# Patient Record
Sex: Female | Born: 1975 | Race: White | Hispanic: No | Marital: Married | Smoking: Never smoker
Health system: Southern US, Community
[De-identification: ages and names within clinical notes are randomized; demographics above are authoritative.]

---

## 2002-07-09 ENCOUNTER — Other Ambulatory Visit: Admission: RE | Admit: 2002-07-09 | Discharge: 2002-07-09 | Payer: Self-pay | Admitting: Obstetrics and Gynecology

## 2003-12-15 ENCOUNTER — Other Ambulatory Visit: Admission: RE | Admit: 2003-12-15 | Discharge: 2003-12-15 | Payer: Self-pay | Admitting: Obstetrics and Gynecology

## 2004-06-11 ENCOUNTER — Inpatient Hospital Stay (HOSPITAL_COMMUNITY): Admission: AD | Admit: 2004-06-11 | Discharge: 2004-06-12 | Payer: Self-pay | Admitting: Obstetrics and Gynecology

## 2004-12-12 ENCOUNTER — Inpatient Hospital Stay (HOSPITAL_COMMUNITY): Admission: AD | Admit: 2004-12-12 | Discharge: 2004-12-12 | Payer: Self-pay | Admitting: Obstetrics and Gynecology

## 2004-12-24 ENCOUNTER — Inpatient Hospital Stay (HOSPITAL_COMMUNITY): Admission: AD | Admit: 2004-12-24 | Discharge: 2004-12-26 | Payer: Self-pay | Admitting: Obstetrics and Gynecology

## 2005-01-03 ENCOUNTER — Encounter: Admission: RE | Admit: 2005-01-03 | Discharge: 2005-02-02 | Payer: Self-pay | Admitting: Obstetrics and Gynecology

## 2005-01-14 ENCOUNTER — Inpatient Hospital Stay (HOSPITAL_COMMUNITY): Admission: AD | Admit: 2005-01-14 | Discharge: 2005-01-17 | Payer: Self-pay | Admitting: Obstetrics and Gynecology

## 2005-02-28 ENCOUNTER — Other Ambulatory Visit: Admission: RE | Admit: 2005-02-28 | Discharge: 2005-02-28 | Payer: Self-pay | Admitting: Obstetrics and Gynecology

## 2006-12-02 ENCOUNTER — Inpatient Hospital Stay (HOSPITAL_COMMUNITY): Admission: AD | Admit: 2006-12-02 | Discharge: 2006-12-02 | Payer: Self-pay | Admitting: Obstetrics and Gynecology

## 2006-12-03 ENCOUNTER — Inpatient Hospital Stay (HOSPITAL_COMMUNITY): Admission: AD | Admit: 2006-12-03 | Discharge: 2006-12-05 | Payer: Self-pay | Admitting: Obstetrics and Gynecology

## 2010-06-22 NOTE — H&P (Signed)
NAME:  Lauren Schneider, Lauren Schneider                 ACCOUNT NO.:  [id number]   MEDICAL RECORD NO.:  [id number]          PATIENT TYPE:  INP   LOCATION:  9137                          FACILITY:  [facility]   PHYSICIAN:  Lauren Schneider, M.D. DATE OF BIRTH:  07/06/1975   DATE OF ADMISSION:  12/03/2006  DATE OF DISCHARGE:                              HISTORY & PHYSICAL   HISTORY:  Lauren Schneider is a 35 year old gravida 4, para 1-0-2-1 at 39-3/7  weeks who presented today to maternity admissions unit in active labor.  Her cervix had been 3-4 cm, yesterday, in maternity admissions.  Uterine  contractions increased at approximately 4:30 a.m. this morning, and when  the patient presented in maternity admissions unit.  She was in a  transitional labor, the patient did then deliver in maternity admissions  very quickly after that.  She did not receive any group B strep  prophylaxis; however, her water was broken just prior to delivery.   PREGNANCY HAS BEEN REMARKABLE FOR:  1. First trimester spotting.  2. History of LEEP procedure.  3. Anxiety disorder.  4. Group B strep positive.   PRENATAL LABS:  Blood type is AB positive, Rh antibody negative, VDRL  nonreactive, Rubella titer positive, hepatitis B surface antigen  negative, HIV is nonreactive.  GC and Chlamydia cultures were negative  in January.  Pap was normal in January, cystic fibrosis testing was  negative.  Hemoglobin upon entry into practice was 12.5.  It was within  normal limits at 28 weeks.  First trimester screen was normal.  Glucola  was normal.  Group B Strep culture was positive at 36 weeks.   HISTORY OF PRESENT PREGNANCY:  The patient entered care at approximately  11-12 weeks she has first trimester screen was normal.  She had an  ultrasound at 18 weeks showing normal growth.  Glucola was normal.  She  had an ultrasound at 36 weeks showing vertex presentation with normal  growth.  Group B strep culture was positive at 36 weeks.  No other  complications.   OBSTETRICAL HISTORY:  In 1992 she had a first trimester termination, she  did have fever 2 days after the termination, but did not require a  rehospitalization.  In 2006 she had a vaginal birth of a female infant,  weight 7 pounds at 37-5/7 weeks.  She was in labor for 4 hours.  She had  a pudendal anesthesia.  In 2007 she had a first trimester miscarriage.   MEDICAL HISTORY:  She had a LEEP procedure in 1999.  She reports the  usual childhood illnesses.  Does have a history of anxiety.  She is not  on any current medication.  She did fracture her nose x2, and other  surgery were tonsils.   FAMILY HISTORY:  Her father had an MI.  Maternal grandmother had a  stroke.  Paternal grandmother had dementia.  Her father had bladder  cancer.  She had a history is unremarkable so the patient is allergic to  erythromycin which causes nausea and vomiting.   SOCIAL HISTORY:  The patient is Caucasian.  She is of the [employer].  Her husband's name is Lauren Schneider.  The patient is college educated, as  is her husband.  She has been followed by the certified nurse midwife  service at [facility].  She denies any alcohol, drug, or tobacco use  during this pregnancy.   PHYSICAL EXAM:  VITAL SIGNS:  Stable.  The patient is afebrile.  HEENT: Within normal limits.  LUNGS:  Breath sounds are clear.  HEART:  Regular rhythm without murmur.  BREASTS:  Soft and nontender.  ABDOMEN:  Fundal height is approximately 38 cm.  Estimated fetal weight  is 7 to 7-1/2 pounds.  Uterine contractions are every 2 minutes,  moderate-to-strong quality.  PELVIC:  Cervix initially was 7 cm, 100% vertex at a minus one station  with bulging bag of water.  Upon attempting to transfer the patient to  labor and delivery, and getting her in wheelchair, the patient began to  note more pressure; this was approximately 5 minutes later.  After 7 cm  her cervix was complete, her water broke during the exam, and  she  delivered quickly after that and delivered quickly after that at 8:06  a.m.  She had a viable female by the name of Lauren Schneider.  No lacerations were  noted.  Fluid was clear.  EXTREMITIES:  Deep tendon reflexes were 2+ without clonus.  There was a  trace edema noted.   IMPRESSION:  1. Intrauterine pregnancy at 39-3/7 weeks.  2. Transitional labor.  3. Positive group B strep with no opportunity for prophylaxis.   PLAN:  1. Admit to a [facility] for consult with Dr.      Silverio Schneider as attending physician.  2. Delivery was accomplished in maternity admissions.  The patient      will, therefore, be transferred from here to postpartum unit.  3. Nursery will be advised of the inability to prophylax for group B      strep.      Lauren Schneider, C.N.M.      Lauren Schneider, M.D.  Electronically Signed    VLL/MEDQ  D:  12/03/2006  T:  12/04/2006  Job:  [id number]

## 2010-06-25 NOTE — Discharge Summary (Signed)
NAME:  Lauren Schneider, Lauren Schneider                 ACCOUNT NO.:  [id number]   MEDICAL RECORD NO.:  [id number]          PATIENT TYPE:  INP   LOCATION:  9302                          [facility]ACILITY:  [facility]   PHYSICIAN:  Hal Morales, M.D.DATE OF BIRTH:  05/11/1975   DATE OF ADMISSION:  01/14/2005  DATE OF DISCHARGE:  01/17/2005                                 DISCHARGE SUMMARY   ADMISSION DIAGNOSES:  1.  Postpartum mastitis.  2.  Fever, related to #1.   HOSPITAL PROCEDURES:  1.  Intravenous antibiotics.  2.  Wound cultures.   HOSPITAL COURSE:  The patient was admitted after having been treated  outpatient for mastitis with initial defervescence x2 days, followed by  recurrence of fever, tenderness and erythema.  She was admitted therefore IV  antibiotic treatment of her current mastitis, which was refractory to  previous antibiotics.  She was treated initially with Keflex and upon  admission, had a fever and induration and erythema of the right breast.  She  was placed on triple antibiotics, including gentamicin and clindamycin and  ampicillin, all given IV.  She continued to pump breast milk throughout her  stay.  On hospital day #2, she was doing well, although breasts remained  sore.  She was afebrile for 24 hours, last fever was January 15, 2005 at 6  a.m.  IV antibiotics were continued until January 16, 2005, when her IV  infiltrated and she was changed to p.o. antibiotics (Augmentin).  On  January 17, 2005, she was doing well.  The breast on the right was  significantly less tender with resolution of erythema, small amounts of  induration continued at the blocked milk ducts, but otherwise status was  improving and she had been without fever for more than 48 hours.  She was  deemed to have received full benefit of her hospital stay and was discharged  home on p.o. Augmentin.   DISCHARGE MEDICATIONS:  1.  Augmentin 500 mg p.o. q.8h. x7 days.  2.  Motrin p.r.n.   DISCHARGE LABORATORY:   Breast milk culture pending.  Urine culture was no  growth.  White blood cell count 10.8, hemoglobin 12.5, platelets 363.   DISCHARGE INSTRUCTIONS:  Routine care with continuing breast feeding and  massage of plugged ducts and monitoring of temperature.  The patient is to  call for temperature over 100.5 or increase in redness or tenderness.   DISCHARGE FOLLOWUP:  In two weeks at [facility] or p.r.n.      Marie L. Williams, C.N.M.      Hal Morales, M.D.  Electronically Signed    MLW/MEDQ  D:  01/17/2005  T:  01/17/2005  Job:  [id number]

## 2010-06-25 NOTE — H&P (Signed)
NAME:  Lauren Schneider, Lauren Schneider                 ACCOUNT NO.:  [id number]   MEDICAL RECORD NO.:  [id number]          PATIENT TYPE:  INP   LOCATION:  9170                          [facility]ACILITY:  [facility]   PHYSICIAN:  Hal Morales, M.D.DATE OF BIRTH:  1976/01/24   DATE OF ADMISSION:  12/24/2004  DATE OF DISCHARGE:                                HISTORY & PHYSICAL   HISTORY OF PRESENT ILLNESS:  This is a 35 year old gravida 2, para 0-0-1-0  at 37-5/7 weeks, who presents after rupturing membranes earlier this evening  with onset of contractions subsequently. She denies any bleeding and reports  positive fetal movement.  The pregnancy has been followed by the nurse  midwife service and remarkable for:  1.  Long cycles.  2.  History of abnormal Pap with LEEP.  3.  Anxiety.   ALLERGIES:  ERYTHROMYCIN causes nausea and vomiting.   OBSTETRIC HISTORY:  Remarkable for elective AB in 1992 complicated by a  fever 2 days later.   PAST MEDICAL HISTORY:  Abnormal pap with LEEP procedure in 1999, history of  childhood varicella, and history of anxiety for which she does not take  medication.   PAST SURGICAL HISTORY:  Remarkable for an EAB in 1992 and tonsillectomy at  age 30.   FAMILY HISTORY:  Remarkable for father with MI.  Grandfather with emphysema.  Mother with diabetes.  Mother and grandmother with stroke.  Father with  bladder cancer.  Sister with anxiety.   GENETIC HISTORY:  Remarkable for a nephew with heart murmur; father of the  baby's uncle with muscular dystrophy; and patient's sister, who had twin  boys.   SOCIAL HISTORY:  The patient is married to [employer], who is involved and  supportive.  She does not report a religious affiliation.  She works as a  Contractor.   PRENATAL LABORATORY DATA:  Hemoglobin 12.2.  Platelets 326,000.  Blood type  AB positive.  Antibody screen negative.  RPR nonreactive.  Rubella immune.  Hepatitis negative.  HIV negative.  Gonorrhea and Chlamydia negative.   Toxo  titers negative.   HISTORY OF PRESENT PREGNANCY:  The patient entered care at [redacted] weeks  gestation.  She was treated for a urinary tract infection early in  pregnancy.  Clot screen was normal.  Anatomy ultrasound was normal.  Glucola  was normal in midpregnancy, and group B strep was negative at term.   OBJECTIVE DATA:  VITAL SIGNS:  Stable.  Afebrile.  HEENT:  Within normal limits.  Thyroid normal.  Not enlarged.  CHEST:  Clear to auscultation.  HEART:  Regular rate and rhythm.  ABDOMEN:  Gravid.  Vertex Leopold's.  EFM shows reactive fetal heart rate  with contractions every 2 minutes.  PELVIC:  Cervix is 5, 90, and -1.  Vertex presentation with positive  Nitrazine and positive ferning.  Clear fluid leaking.  EXTREMITIES:  Within normal limits.   ASSESSMENT:  1.  Intrauterine pregnancy at term.  2.  Spontaneous rupture of membranes.  3.  Active labor.   PLAN:  1.  Admit to birthing suite per Dr. Pennie Rushing.  2.  Routine CNM orders.  3.  Epidural p.r.n.      Marie L. Williams, C.N.M.      Hal Morales, M.D.  Electronically Signed    MLW/MEDQ  D:  12/24/2004  T:  12/24/2004  Job:  [id number]

## 2010-06-25 NOTE — H&P (Signed)
NAME:  Lauren Schneider, Lauren Schneider                 ACCOUNT NO.:  [id number]   MEDICAL RECORD NO.:  [id number]          PATIENT TYPE:  MAT   LOCATION:  MATC                          [facility]:  [facility]   PHYSICIAN:  Janine Limbo, M.D.DATE OF BIRTH:  05/28/1975   DATE OF ADMISSION:  01/14/2005  DATE OF DISCHARGE:                                HISTORY & PHYSICAL   HISTORY OF PRESENT ILLNESS:  Lauren Schneider is a 35 year old, gravida 2, para 1-0-  1-1, who is admitted at 77 days postpartum with a fever of 103 degrees,  possible mastitis and pelvic tenderness.  The patient was initiated on a  course of antibiotic for mastitis on Monday with fever present at that time.  Subsequent to that visit, the patient reports her symptoms improved for  three days, however, today she began having increased fever and body aches  again.  The patient reports that both her breasts are sore bilaterally but  she has no red areas.  The patient reports that she is using Tylenol and  ibuprofen to treat her fever.  The patient reports that she has continued to  pump breast milk as her infant does not latch to the breast.  The patient  denies any nausea or vomiting, abdominal pain, UTI signs and symptoms.  The  patient reports normal lochia and very small amounts which has been brown to  light pink today without odor.  She denies any flu or upper respiratory  infection signs and symptoms.  The patient reports that she has normal bowel  and bladder habits at the present time.   CURRENT MEDICATIONS:  Keflex, ibuprofen, Tylenol, prenatal vitamins.   ALLERGIES:  E-MYCIN causes stomach upset.   OBSTETRIC HISTORY:  1.  Pregnancy #1, elective interruption of pregnancy.  2.  Pregnancy #2, December 24, 2004, spontaneous vaginal delivery, viable      female infant, without complications at term.   GYNECOLOGIC HISTORY:  Significant for dysplasia with LEEP, 1999.   MEDICAL HISTORY:  Negative.   SURGICAL HISTORY:  1.  Tonsillectomy at  35 years old.  2.  Wisdom teeth at 35 years old.  3.  Therapeutic abortion, 1992.   HOSPITALIZATIONS:  Childbirth only.   SOCIAL HISTORY:  The patient is a married white female with negative  domestic violence screen.  The patient denies tobacco, alcohol, or street  drugs.   PHYSICAL EXAMINATION:  VITAL SIGNS:  Temperature 103.2 degrees, blood  pressure 103/66, heart rate 133, respirations 24.  GENERAL APPEARANCE:  The patient is in no apparent distress, however, she is  ill-appearing and well nourished female.  Color is satisfactory.  SKIN:  Warm and dry.  HEENT:  Within normal limits.  LUNGS:  Clear.  HEART:  Regular rate and rhythm.  BREASTS:  Engorged bilaterally with no areas of redness noted.  The right  breast is more engorged than the left breast with areas of plugged milk  ducts noted in upper outer quadrant of the right breast, more prominent than  left.  BACK:  Negative CVA tenderness.  ABDOMEN:  Soft and nontender with positive bowel sounds in all  four  quadrants.  EXTREMITIES:  Negative edema.  Negative Homan sign bilaterally.  PELVIC:  External genitalia within normal limits.  Small amount of light  pink discharge noted in the vaginal vault.  Cervix with no cervical motion  tenderness noted.  Uterus is firm, very mildly tender, and appropriately  involuting.  Adnexa are mildly tender.  Skin is intact.   LABORATORY:  WBC 16.4, hemoglobin 13.4, hematocrit 39.8, platelets 322,000,  neutrophils 90%.  Chemistries are within normal limits.  Cath UA, small  amount of blood.  Influenza A and B swabs are negative.  Blood cultures are  pending at the current time as is breast milk culture.  Ultrasound of the  pelvis revealed a 1.4-cm avascular area left of the uterus, nonspecific.  Chest x-ray was negative.   ASSESSMENT:  1.  Postpartum 22 days.  2.  Fever.  3.  Mastitis, failed outpatient treatment.  4.  Pelvic tenderness.   PLAN:  1.  Consult was obtained with Dr.  Stefano Gaul who feels admission at this point      in time will be indicated secondary fever.  2.  The patient will be admitted to [facility] unit.  3.  The patient will be started on gentamicin, ampicillin, and clindamycin      antibiotics.  4.  Vital signs will continue to be monitored carefully.  5.  Further orders per M.D.      Lauren Schneider, CNM      Janine Limbo, M.D.  Electronically Signed    NOS/MEDQ  D:  01/14/2005  T:  01/14/2005  Job:  [id number]

## 2010-11-17 LAB — CBC
HCT: 33.4 — ABNORMAL LOW
Hemoglobin: 11.5 — ABNORMAL LOW
MCHC: 34.5
MCV: 89.2
Platelets: 169
RBC: 3.75 — ABNORMAL LOW
RDW: 15.1 — ABNORMAL HIGH
WBC: 8.2

## 2011-04-28 ENCOUNTER — Encounter (INDEPENDENT_AMBULATORY_CARE_PROVIDER_SITE_OTHER): Payer: BC Managed Care – PPO | Admitting: Obstetrics and Gynecology

## 2011-04-28 DIAGNOSIS — N915 Oligomenorrhea, unspecified: Secondary | ICD-10-CM

## 2011-04-28 DIAGNOSIS — N926 Irregular menstruation, unspecified: Secondary | ICD-10-CM

## 2011-05-11 ENCOUNTER — Encounter (INDEPENDENT_AMBULATORY_CARE_PROVIDER_SITE_OTHER): Payer: BC Managed Care – PPO | Admitting: Obstetrics and Gynecology

## 2011-05-11 ENCOUNTER — Other Ambulatory Visit (INDEPENDENT_AMBULATORY_CARE_PROVIDER_SITE_OTHER): Payer: BC Managed Care – PPO

## 2011-05-11 DIAGNOSIS — N915 Oligomenorrhea, unspecified: Secondary | ICD-10-CM

## 2011-05-26 ENCOUNTER — Ambulatory Visit: Payer: BC Managed Care – PPO | Admitting: Obstetrics and Gynecology

## 2013-09-24 ENCOUNTER — Other Ambulatory Visit: Payer: Self-pay | Admitting: Obstetrics and Gynecology

## 2013-09-24 DIAGNOSIS — Z1231 Encounter for screening mammogram for malignant neoplasm of breast: Secondary | ICD-10-CM

## 2013-09-24 DIAGNOSIS — N926 Irregular menstruation, unspecified: Secondary | ICD-10-CM | POA: Insufficient documentation

## 2013-09-30 ENCOUNTER — Ambulatory Visit
Admission: RE | Admit: 2013-09-30 | Discharge: 2013-09-30 | Disposition: A | Discharge status: Home / Self Care | Payer: BC Managed Care – PPO | Source: Ambulatory Visit | Attending: Obstetrics and Gynecology | Admitting: Obstetrics and Gynecology

## 2013-09-30 ENCOUNTER — Encounter (INDEPENDENT_AMBULATORY_CARE_PROVIDER_SITE_OTHER): Payer: Self-pay

## 2013-09-30 DIAGNOSIS — Z1231 Encounter for screening mammogram for malignant neoplasm of breast: Secondary | ICD-10-CM

## 2016-08-21 ENCOUNTER — Emergency Department (HOSPITAL_COMMUNITY)
Admission: EM | Admit: 2016-08-21 | Discharge: 2016-08-21 | Disposition: A | Discharge status: Home / Self Care | Payer: 59 | Attending: Emergency Medicine | Admitting: Emergency Medicine

## 2016-08-21 ENCOUNTER — Emergency Department (HOSPITAL_COMMUNITY): Payer: 59

## 2016-08-21 ENCOUNTER — Encounter: Payer: Self-pay | Admitting: Emergency Medicine

## 2016-08-21 DIAGNOSIS — Z23 Encounter for immunization: Secondary | ICD-10-CM | POA: Diagnosis not present

## 2016-08-21 DIAGNOSIS — W19XXXA Unspecified fall, initial encounter: Secondary | ICD-10-CM

## 2016-08-21 DIAGNOSIS — Y999 Unspecified external cause status: Secondary | ICD-10-CM | POA: Diagnosis not present

## 2016-08-21 DIAGNOSIS — Y92095 Swimming-pool of other non-institutional residence as the place of occurrence of the external cause: Secondary | ICD-10-CM | POA: Diagnosis not present

## 2016-08-21 DIAGNOSIS — Y9301 Activity, walking, marching and hiking: Secondary | ICD-10-CM | POA: Diagnosis not present

## 2016-08-21 DIAGNOSIS — S59911A Unspecified injury of right forearm, initial encounter: Secondary | ICD-10-CM | POA: Diagnosis present

## 2016-08-21 DIAGNOSIS — S52124A Nondisplaced fracture of head of right radius, initial encounter for closed fracture: Secondary | ICD-10-CM

## 2016-08-21 DIAGNOSIS — W010XXA Fall on same level from slipping, tripping and stumbling without subsequent striking against object, initial encounter: Secondary | ICD-10-CM | POA: Insufficient documentation

## 2016-08-21 MED ORDER — BACITRACIN ZINC 500 UNIT/GM EX OINT
TOPICAL_OINTMENT | CUTANEOUS | Status: AC
  Administered 2016-08-21: 20:00:00
  Filled 2016-08-21: qty 8.1

## 2016-08-21 MED ORDER — IBUPROFEN 200 MG PO TABS
600.0000 mg | ORAL_TABLET | Freq: Once | ORAL | Status: AC
  Administered 2016-08-21: 600 mg via ORAL
  Filled 2016-08-21: qty 3

## 2016-08-21 MED ORDER — TRAMADOL HCL 50 MG PO TABS: 50.0000 mg | ORAL_TABLET | Freq: Four times a day (QID) | ORAL | 0 refills | Status: AC | PRN

## 2016-08-21 MED ORDER — TETANUS-DIPHTH-ACELL PERTUSSIS 5-2.5-18.5 LF-MCG/0.5 IM SUSP
0.5000 mL | Freq: Once | INTRAMUSCULAR | Status: AC
  Administered 2016-08-21: 0.5 mL via INTRAMUSCULAR
  Filled 2016-08-21: qty 0.5

## 2016-08-21 NOTE — Discharge Instructions (Signed)
Please call for follow up with Dr. Magnus IvanBlackman in the next week. If you develop worsening or new concerning symptoms you can return to the emergency department for re-evaluation.    A radial head fracture is a break (fracture) in the smaller bone in your forearm (radius). There are two bones in your forearm. The radius, or radial bone, is the bone on the side of your thumb. The break is at the head of the bone, which is at the elbow joint. These breaks usually happen because of an injury, such as falling on your arm when you are reaching out with it. Follow these instructions at home: If you have a splint or sling:  Wear the splint or sling as told by your doctor. Remove it only as told by your doctor.  Loosen the splint if your fingers tingle, become numb, or turn cold and blue.  Keep the splint or sling clean and dry. If you have a cast:  Do not stick anything inside the cast to scratch your skin.  Check the skin around the cast every day. Tell your doctor about any concerns.  You may put lotion on dry skin around the edges of the cast. Do not put lotion on the skin under the cast. Bathing  Do not take baths, swim, or use a hot tub until your doctor says it is okay. Ask your doctor if you can take showers. You may only be allowed to take sponge baths for bathing.  If your doctor says it is okay for you to take baths or showers, you may need to cover the cast or splint with a plastic bag to protect it from water. Managing pain, stiffness, and swelling  If directed, put ice on the injured area. ? Put ice in a plastic bag. ? Place a towel between your skin and the bag. ? Leave the ice on for 20 minutes, 2-3 times a day.  Move your fingers often to avoid stiffness and to lessen swelling.  Raise (elevate) the injured area above the level of your heart while you are sitting or lying down.  For pain control you may take:  800mg  of ibuprofen (that is usually 4 over the counter pills)  3  times a day (take with food) and acetaminophen 975mg  (this is 3 over the counter pills) four times a day. Do not drink alcohol or combine with other medications that have acetaminophen as an ingredient (Read the labels!).  For breakthrough pain you may take tramadol. Do not drink alcohol drive or operate heavy machinery when taking tramadol. Driving  Do not drive or use heavy machinery while taking prescription pain medicine.   Ask your doctor when it is safe to drive if you have a cast, splint, or sling on your arm. General instructionss.  Do not use any tobacco products, such as cigarettes, chewing tobacco, and e-cigarettes. Tobacco can delay bone healing. If you need help quitting, ask your doctor.  Keep all follow-up visits as told by your doctor. This is important. Contact a doctor if:  You have problems with your cast or splint.  You have pain or swelling that gets worse. Get help right away if:  You have very bad pain when you stretch your fingers.  You have fluid or a bad smell coming from your splint.  Your hand or fingers get cold or turn pale or blue.  You lose feeling in any part of your hand or arm.

## 2016-08-21 NOTE — ED Triage Notes (Signed)
Pt was walking , tripped and fell landing on rt hand, wrist. Pain goes to elbow, difficult to extend arm.

## 2016-08-21 NOTE — ED Provider Notes (Signed)
[facility]-EMERGENCY DEPT Provider Note   CSN: [id number] Arrival date & time: 08/21/16  1738   By signing my name below, I, Soijett Blue, attest that this documentation has been prepared under the direction and in the presence of Leary Roca, [employer] Electronically Signed: Soijett Blue, ED Scribe. 08/21/16. 6:48 PM.  History   Chief Complaint Chief Complaint  Patient presents with  . Fall  . Arm Injury    HPI Lauren Schneider is a 41 y.o. female who presents to the Emergency Department complaining of right arm injury s/p fall occurring PTA. Pt reports associated right hand pain, right elbow pain, tingling to right hand/right thumb, and abrasion to left knee. Pt has not tried any medications for the relief of her symptoms. She notes that she was ambulating on the side of a pool when her younger son tripped her and she fell onto her outstretched right arm. Pt states that she is unsure of the status of her tetanus vaccination. She denies knee pain, swelling, color change, and any other symptoms.    The history is provided by the patient and the spouse. No language interpreter was used.    No past medical history on file.  There are no active problems to display for this patient.   No past surgical history on file.  OB History    No data available       Home Medications    Prior to Admission medications   Medication Sig Start Date End Date Taking? Authorizing Provider  traMADol (ULTRAM) 50 MG tablet Take 1 tablet (50 mg total) by mouth every 6 (six) hours as needed for severe pain. 08/21/16   Vannessa Godown, Elmer Sow, PA-C    Family History No family history on file.  Social History Social History  Substance Use Topics  . Smoking status: Not on file  . Smokeless tobacco: Not on file  . Alcohol use Not on file     Allergies   Bee venom   Review of Systems Review of Systems  Musculoskeletal: Positive for arthralgias (right arm, right hand, right elbow). Negative for joint  swelling.  Skin: Positive for wound (abrasion to left knee). Negative for color change.  Neurological: Negative for weakness and numbness.       +tingling to right hand/right thumb     Physical Exam Updated Vital Signs BP 128/72 (BP Location: Left Arm)   Pulse (!) 50   Temp 98.3 F (36.8 C) (Oral)   Resp 18   Ht 5\' 2"  (1.575 m)   Wt 104 lb (47.2 kg)   SpO2 100%   BMI 19.02 kg/m   Physical Exam  Constitutional: She appears well-developed and well-nourished.  HENT:  Head: Normocephalic and atraumatic.  Right Ear: External ear normal.  Left Ear: External ear normal.  Eyes: Conjunctivae are normal. Right eye exhibits no discharge. Left eye exhibits no discharge. No scleral icterus.  Pulmonary/Chest: Effort normal. No respiratory distress.  Musculoskeletal:       Right shoulder: Normal. She exhibits normal range of motion.       Right elbow: Tenderness found. Lateral epicondyle tenderness noted.       Right wrist: Normal.       Left knee: Normal. She exhibits normal range of motion, no swelling, no effusion, no deformity and no bony tenderness. No tenderness found.       Right hand: Normal. Normal sensation noted. Normal strength noted.  No snuff box tenderness. Tenderness to posterior and medial right elbow greatest  over lateral epicondyle. Pain with supination and pronation Flexion of elbow to 90 degrees. Extension of elbow to approximately 170 degrees. No wrist drop. SILT. Intact sensation to sharp dull for ulnar, radial, and medial distribution. Radial pulse 2+. Cap refill <2 seconds. NVI. Compartments soft above and below site of injury.    Neurological: She is alert. She has normal strength. No sensory deficit. Gait normal.  Skin: No pallor.  Abrasion to left knee  Psychiatric: She has a normal mood and affect.  Nursing note and vitals reviewed.    ED Treatments / Results  DIAGNOSTIC STUDIES: Oxygen Saturation is 100% on RA, nl by my interpretation.    COORDINATION OF  CARE: 6:28 PM Discussed treatment plan with pt at bedside and pt agreed to plan.   Labs (all labs ordered are listed, but only abnormal results are displayed) Labs Reviewed - No data to display  EKG  EKG Interpretation None       Radiology Dg Elbow Complete Right  Result Date: 08/21/2016 CLINICAL DATA:  Fall.  Right elbow pain. EXAM: RIGHT ELBOW - COMPLETE 3+ VIEW COMPARISON:  None. FINDINGS: Nondisplaced right radial head fracture with right elbow joint effusion. No additional fracture. No dislocation. No suspicious focal osseous lesion. No radiopaque foreign body. IMPRESSION: Nondisplaced right radial head fracture with right elbow joint effusion. Electronically Signed   By: Delbert PhenixJason A Poff M.D.   On: 08/21/2016 18:26   Dg Wrist Complete Right  Result Date: 08/21/2016 CLINICAL DATA:  Right wrist pain after fall today EXAM: RIGHT WRIST - COMPLETE 3+ VIEW COMPARISON:  None. FINDINGS: There is no evidence of fracture or dislocation. There is no evidence of arthropathy or other focal bone abnormality. Soft tissues are unremarkable. IMPRESSION: Negative. Electronically Signed   By: Delbert PhenixJason A Poff M.D.   On: 08/21/2016 18:22   Dg Hand Complete Right  Result Date: 08/21/2016 CLINICAL DATA:  Fall today.  Right hand pain. EXAM: RIGHT HAND - COMPLETE 3+ VIEW COMPARISON:  None. FINDINGS: There is no evidence of fracture or dislocation. There is no evidence of arthropathy or other focal bone abnormality. Soft tissues are unremarkable. IMPRESSION: Negative. Electronically Signed   By: Delbert PhenixJason A Poff M.D.   On: 08/21/2016 18:14    Procedures Procedures (including critical care time)  Medications Ordered in ED Medications  ibuprofen (ADVIL,MOTRIN) tablet 600 mg (not administered)  Tdap (BOOSTRIX) injection 0.5 mL (not administered)  bacitracin 500 UNIT/GM ointment (not administered)     Initial Impression / Assessment and Plan / ED Course  I have reviewed the triage vital signs and the nursing  notes.  Pertinent labs & imaging results that were available during my care of the patient were reviewed by me and considered in my medical decision making (see chart for details).     41 year old female presents s/p fall onto outstretched right arm today. Patient X-Ray positive for "Nondisplaced right radial head fracture with right elbow joint effusion." Pt will be treated with advil while in the ED. Pt advised to follow up with orthopedics. Patient given posterior splint and arm sling while in ED. Patient updated on tetanus vaccine and left knee cleaned. Pt will be discharged Ultram prescription. Conservative therapy recommended and discussed. I advised the patient to follow-up with Ortho this week. I advised the patient to return to the emergency department with new or worsening symptoms or new concerns. Specific return precautions discussed. The patient verbalized understanding and agreement with plan. All questions answered. No further questions at this  time. Patient appears safe for discharge.  Final Clinical Impressions(s) / ED Diagnoses   Final diagnoses:  Closed nondisplaced fracture of head of right radius, initial encounter  Fall, initial encounter    New Prescriptions New Prescriptions   TRAMADOL (ULTRAM) 50 MG TABLET    Take 1 tablet (50 mg total) by mouth every 6 (six) hours as needed for severe pain.   I personally performed the services described in this documentation, which was scribed in my presence. The recorded information has been reviewed and is accurate.     Princella Pellegrini 08/21/16 Kerrin Champagne, MD 08/22/16 Marlyne Beards

## 2016-08-21 NOTE — ED Notes (Signed)
Bed: WTR6 Expected date:  Expected time:  Means of arrival:  Comments: 

## 2016-08-24 ENCOUNTER — Ambulatory Visit (INDEPENDENT_AMBULATORY_CARE_PROVIDER_SITE_OTHER): Payer: 59 | Admitting: Physician Assistant

## 2016-08-24 DIAGNOSIS — S52124A Nondisplaced fracture of head of right radius, initial encounter for closed fracture: Secondary | ICD-10-CM

## 2016-08-24 NOTE — Progress Notes (Signed)
   Office Visit Note   Patient: Lauren MoatCindy L Sommerfeld           Date of Birth: 1976-01-16           MRN: [id number] Visit Date: 08/24/2016              Requested by: Henreitta LeberPowell, [place], PA-C [address] [place], [place] [address] PCP: Henreitta LeberPowell, [place], PA-C   Assessment & Plan: Visit Diagnoses:  1. Closed nondisplaced fracture of head of right radius, initial encounter     Plan: Keep the long-arm splint clean dry and intact. We'll see her back in 2 weeks' remove the splint obtain AP lateral views of the right elbow. Elevation wiggling fingers only encouraged. Questions encouraged and answered  Follow-Up Instructions: Return in about 2 weeks (around 09/07/2016) for Radiographs.   Orders:  No orders of the defined types were placed in this encounter.  No orders of the defined types were placed in this encounter.     Procedures: No procedures performed   Clinical Data: No additional findings.   Subjective: Chief Complaint  Patient presents with  . Right Arm - Injury    HPI Mrs. Lauren Schneider is a 41 year old female who unfortunately fell over her 43-year-old son on 7:15 and 7018. She went to the ER where radiographs were obtained of her right wrist and right elbow. Personally reviewed these x-rays. The right wrist shows no acute fractures no bony abnormalities. The right elbow shows a nondisplaced nondisplaced radial head fracture which is intra-articular. Otherwise the elbow as well located. No other bony abnormalities or lesions. She denies any other injuries at the time of fall. Denies any loss consciousness dizziness or chest pain at time of fall.  Review of Systems  All other systems reviewed and are negative.  please see history of present illness   Objective: Vital Signs: There were no vitals taken for this visit.  Physical Exam  Constitutional: She is oriented to person, place, and time. She appears well-developed and well-nourished. No distress.  Pulmonary/Chest:  Effort normal.  Neurological: She is alert and oriented to person, place, and time.  Skin: She is not diaphoretic.  Psychiatric: She has a normal mood and affect. Her behavior is normal.    Ortho Exam Right arm well padded posterior long arm splint. Splints clean dry and intact. Fingers are well perfused. Sensation intact. Specialty Comments:  No specialty comments available.  Imaging: No results found.   PMFS History: There are no active problems to display for this patient.  No past medical history on file.  No family history on file.  No past surgical history on file. Social History   Occupational History  . Not on file.   Social History Main Topics  . Smoking status: Not on file  . Smokeless tobacco: Not on file  . Alcohol use Not on file  . Drug use: Unknown  . Sexual activity: Not on file

## 2016-09-05 ENCOUNTER — Ambulatory Visit (INDEPENDENT_AMBULATORY_CARE_PROVIDER_SITE_OTHER): Payer: 59 | Admitting: Physician Assistant

## 2016-09-05 ENCOUNTER — Ambulatory Visit (INDEPENDENT_AMBULATORY_CARE_PROVIDER_SITE_OTHER): Payer: 59

## 2016-09-05 DIAGNOSIS — S52124D Nondisplaced fracture of head of right radius, subsequent encounter for closed fracture with routine healing: Secondary | ICD-10-CM

## 2016-09-05 DIAGNOSIS — R87610 Atypical squamous cells of undetermined significance on cytologic smear of cervix (ASC-US): Secondary | ICD-10-CM | POA: Insufficient documentation

## 2016-09-05 DIAGNOSIS — A599 Trichomoniasis, unspecified: Secondary | ICD-10-CM | POA: Insufficient documentation

## 2016-09-05 NOTE — Progress Notes (Signed)
Lauren Schneider returns today follow-up of her right radial head nondisplaced fracture. She's been in a long-arm splint. She does note that whenever she tries to twist anything with her wrist that she has soreness in the elbow region and the entire arm. She has no complaints otherwise.  Physical exam right elbow she has tenderness over the radial head. She has significant ecchymosis down into the thenar aspect of the hand. Lacks full extension of the elbow by 10. Near normal flexion. Able to supinate and pronate. Radial pulse 2+. Sensation grossly intact throughout the hand.  Radiographs: AP lateral right elbow: Fracture radial head remains nondisplaced. Lateral view shows some early consolidation. Otherwise no acute fractures.   Plan: We'll remove the posterior splint. Have her work on range of motion of the elbow and supination pronation. No heavy lifting with the arm recommend nothing greater than 5 pounds. She'll follow with us in one month mainly check her range of motion. No radiographs at that time unless clinically indicated.

## 2016-09-19 ENCOUNTER — Ambulatory Visit (INDEPENDENT_AMBULATORY_CARE_PROVIDER_SITE_OTHER): Payer: 59 | Admitting: Family

## 2016-09-19 ENCOUNTER — Encounter (INDEPENDENT_AMBULATORY_CARE_PROVIDER_SITE_OTHER): Payer: Self-pay | Admitting: Family

## 2016-09-19 VITALS — Ht 62.0 in | Wt 104.0 lb

## 2016-09-19 DIAGNOSIS — M25512 Pain in left shoulder: Secondary | ICD-10-CM

## 2016-09-21 NOTE — Progress Notes (Signed)
Office Visit Note   Patient: Lauren Schneider           Date of Birth: February 27, 1975           MRN: [id number] Visit Date: 09/19/2016              Requested by: Henreitta LeberPowell, [place], PA-C [address] [place], [place] [address] PCP: Henreitta LeberPowell, [place], PA-C  Chief Complaint  Patient presents with  . Left Shoulder - Pain    Several weeks ago caught herself from falling on stair climber pulled shoulder       HPI: The patient is a 41 year old woman who presents today complaining of left shoulder pain. She states that she has had a recent right wrist fracture and has been babying the right upper extremity. She was recently at the gym on a stair climber and was about to fall she caught herself with the left upper extremity and felt something pop in her armpit on the left. This is been ongoing for about 2 weeks. She has pain with across body and above head reaching. She feels she must support her arm to reach higher than 90. She points under her arm feeling a "pulled feeling."  Resents today concerned for her RC tear.  Assessment & Plan: Visit Diagnoses:  1. Acute pain of left shoulder     Plan: Will trial Aleve twice daily for 2 weeks. Reassurance provided doubt rotator cuff tear. Follow-up in office with Bronson CurbGil in 2 weeks as well as resolve uneventfully.  Follow-Up Instructions: Return for As scheduled with Bronson CurbGil in 2 weeks.   Left Shoulder Exam   Tenderness  The patient is experiencing tenderness in the biceps tendon.  Range of Motion  Passive Abduction: normal  Extension: normal   Muscle Strength  The patient has normal left shoulder strength.  Tests  Drop Arm: negative Impingement: positive      Patient is alert, oriented, no adenopathy, well-dressed, normal affect, normal respiratory effort.   Imaging: No results found. No images are attached to the encounter.  Labs: No results found for: HGBA1C, ESRSEDRATE, CRP, LABURIC, REPTSTATUS, GRAMSTAIN, CULT,  LABORGA  Orders:  No orders of the defined types were placed in this encounter.  No orders of the defined types were placed in this encounter.    Procedures: No procedures performed  Clinical Data: No additional findings.  ROS:  All other systems negative, except as noted in the HPI. Review of Systems  Constitutional: Negative for chills and fever.  Musculoskeletal: Positive for arthralgias and myalgias. Negative for joint swelling.  Neurological: Negative for weakness and numbness.    Objective: Vital Signs: Ht 5\' 2"  (1.575 m)   Wt 104 lb (47.2 kg)   BMI 19.02 kg/m   Specialty Comments:  No specialty comments available.  PMFS History: Patient Active Problem List   Diagnosis Date Noted  . Closed nondisplaced fracture of head of right radius with routine healing 09/05/2016  . Atypical squamous cells of undetermined significance on cytologic smear of cervix (ASC-US) 09/05/2016  . Trichomoniasis 09/05/2016  . Irregular periods 09/24/2013   No past medical history on file.  No family history on file.  No past surgical history on file. Social History   Occupational History  . Not on file.   Social History Main Topics  . Smoking status: Never Smoker  . Smokeless tobacco: Never Used  . Alcohol use Not on file  . Drug use: Unknown  . Sexual activity: Not on file

## 2016-10-03 ENCOUNTER — Encounter (INDEPENDENT_AMBULATORY_CARE_PROVIDER_SITE_OTHER): Payer: Self-pay | Admitting: Physician Assistant

## 2016-10-03 ENCOUNTER — Ambulatory Visit (INDEPENDENT_AMBULATORY_CARE_PROVIDER_SITE_OTHER): Payer: 59 | Admitting: Physician Assistant

## 2016-10-03 VITALS — Ht 62.0 in | Wt 104.0 lb

## 2016-10-03 DIAGNOSIS — S46912A Strain of unspecified muscle, fascia and tendon at shoulder and upper arm level, left arm, initial encounter: Secondary | ICD-10-CM | POA: Insufficient documentation

## 2016-10-03 DIAGNOSIS — S46912D Strain of unspecified muscle, fascia and tendon at shoulder and upper arm level, left arm, subsequent encounter: Secondary | ICD-10-CM

## 2016-10-03 DIAGNOSIS — S52124D Nondisplaced fracture of head of right radius, subsequent encounter for closed fracture with routine healing: Secondary | ICD-10-CM

## 2016-10-03 NOTE — Progress Notes (Addendum)
Lauren Schneider returns today follow-up now possibly 6 weeks status post right radial head fracture nondisplaced. She also had an injury to her left shoulder while getting on a stair stepper. She states the left arm is overall improving. Range of motion is greatly improved. Right arm she is doing well slowly getting better. Some soreness at times.  Physical exam right arm she is nontender over her radial head. She has full supination pronation. Full flexion-extension of the arm. Left shoulder 5 out of 5 strengths with external and internal rotation against resistance. Negative impingement. Biceps is intact. There is no rashes skin lesions ulcerations erythema or ecchymosis about the left shoulder girdle.   Impression: 1.Right radial head fracture nondisplaced          2. Left shoulder strain  Plan:. No lifting greater than 10 pounds for the next month right arm. Then return to activities as tolerated.We did discuss with her rowing which she likes to do for exercise. She can do rowing for exercise in 2 weeks. Inregards to the left shoulder if she develops any prolonged pain weakness or decreased range of motion she'll return for follow-up. Otherwise follow with [place] on an as-needed basis.

## 2017-03-02 DIAGNOSIS — R509 Fever, unspecified: Secondary | ICD-10-CM | POA: Diagnosis not present

## 2017-09-22 DIAGNOSIS — N871 Moderate cervical dysplasia: Secondary | ICD-10-CM | POA: Diagnosis not present

## 2017-09-22 DIAGNOSIS — J301 Allergic rhinitis due to pollen: Secondary | ICD-10-CM | POA: Diagnosis not present

## 2017-12-07 DIAGNOSIS — Z6820 Body mass index (BMI) 20.0-20.9, adult: Secondary | ICD-10-CM | POA: Diagnosis not present

## 2017-12-07 DIAGNOSIS — Z01419 Encounter for gynecological examination (general) (routine) without abnormal findings: Secondary | ICD-10-CM | POA: Diagnosis not present

## 2017-12-07 DIAGNOSIS — Z1231 Encounter for screening mammogram for malignant neoplasm of breast: Secondary | ICD-10-CM | POA: Diagnosis not present

## 2018-03-28 IMAGING — CR DG ELBOW COMPLETE 3+V*R*
4 series · 4 of 4 positions shown · non-contrast
Comparison: None.

CLINICAL DATA: Fall.  Right elbow pain.

EXAM:
RIGHT ELBOW - COMPLETE 3+ VIEW

[x elbow obl right (1 of 2)]
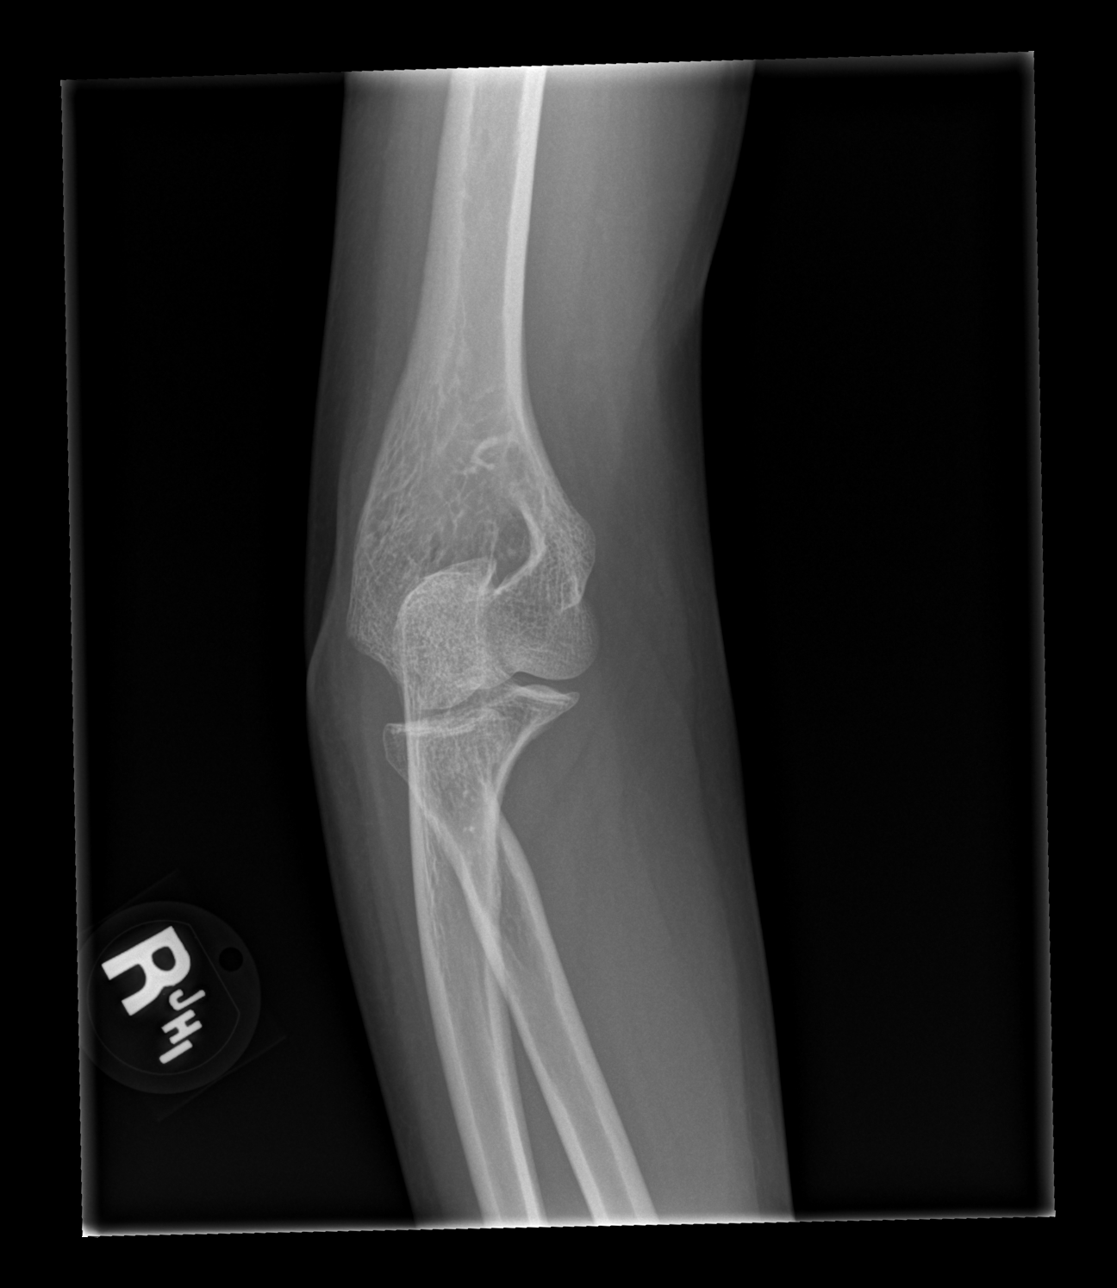

[x elbow lat right]
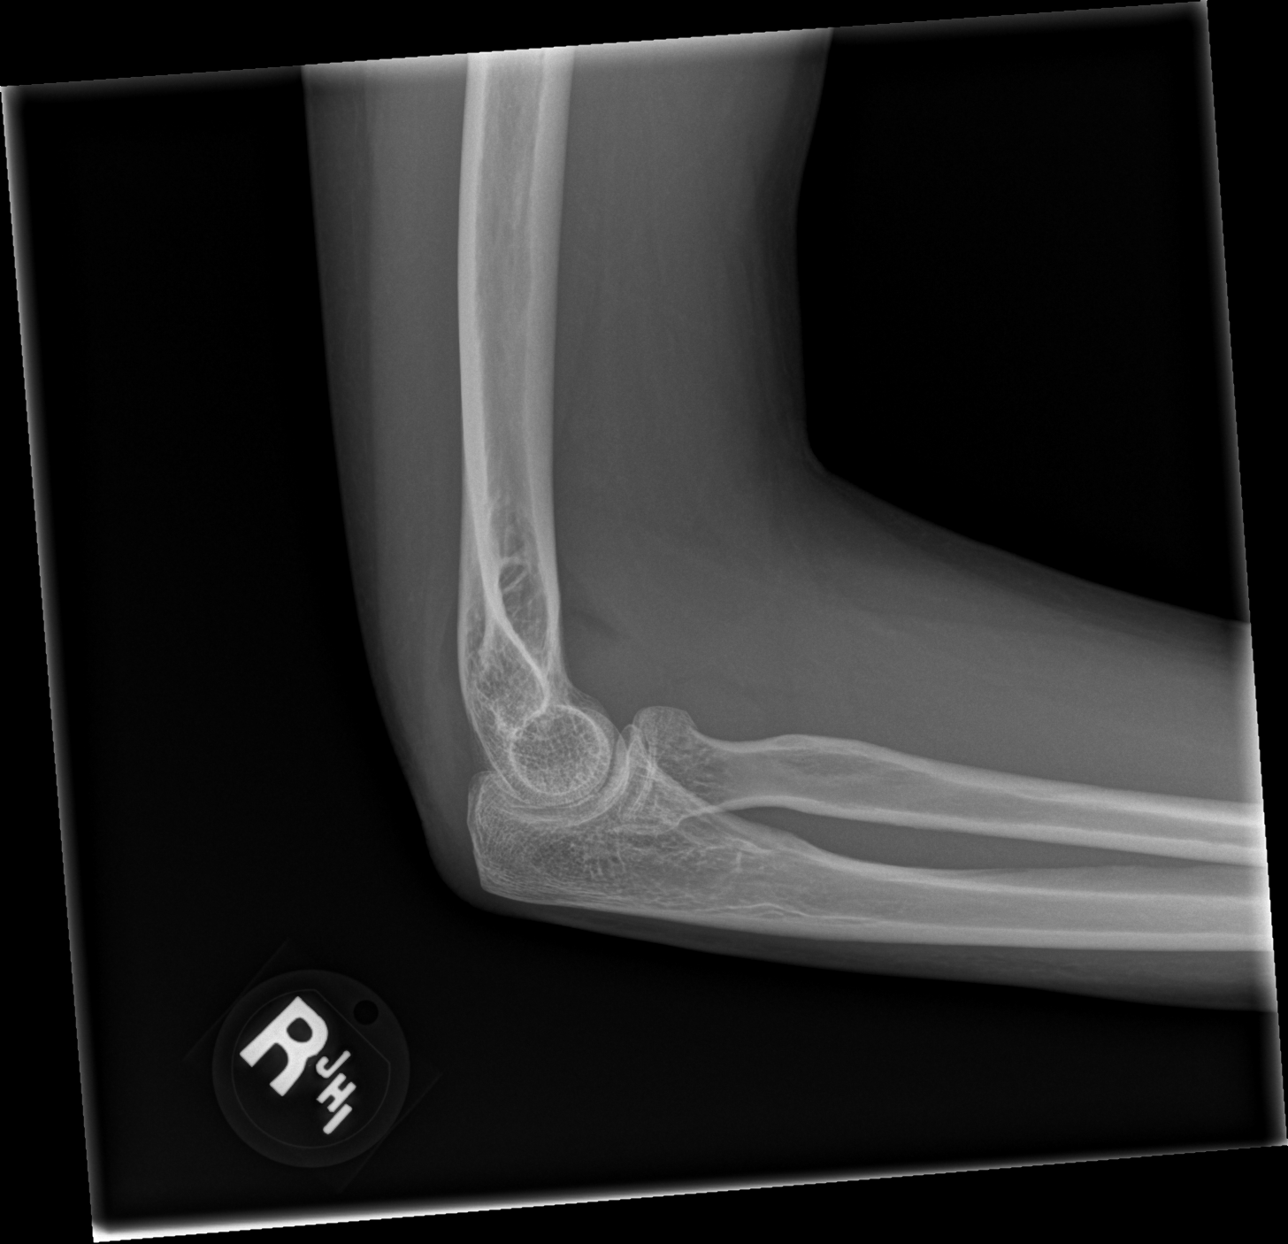

[x elbow ap right]
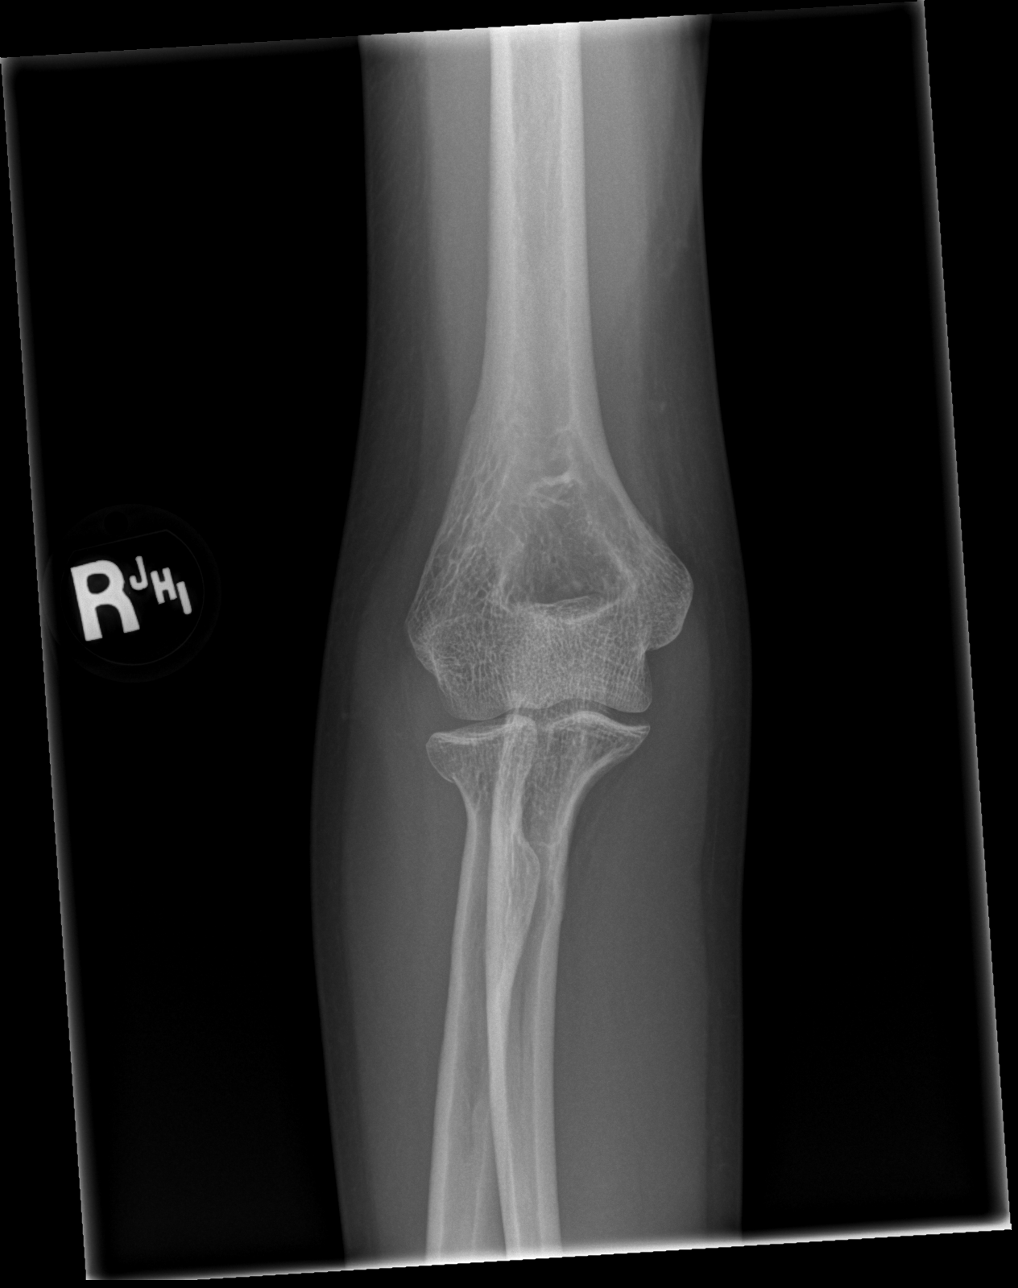

[x elbow obl right (2 of 2)]
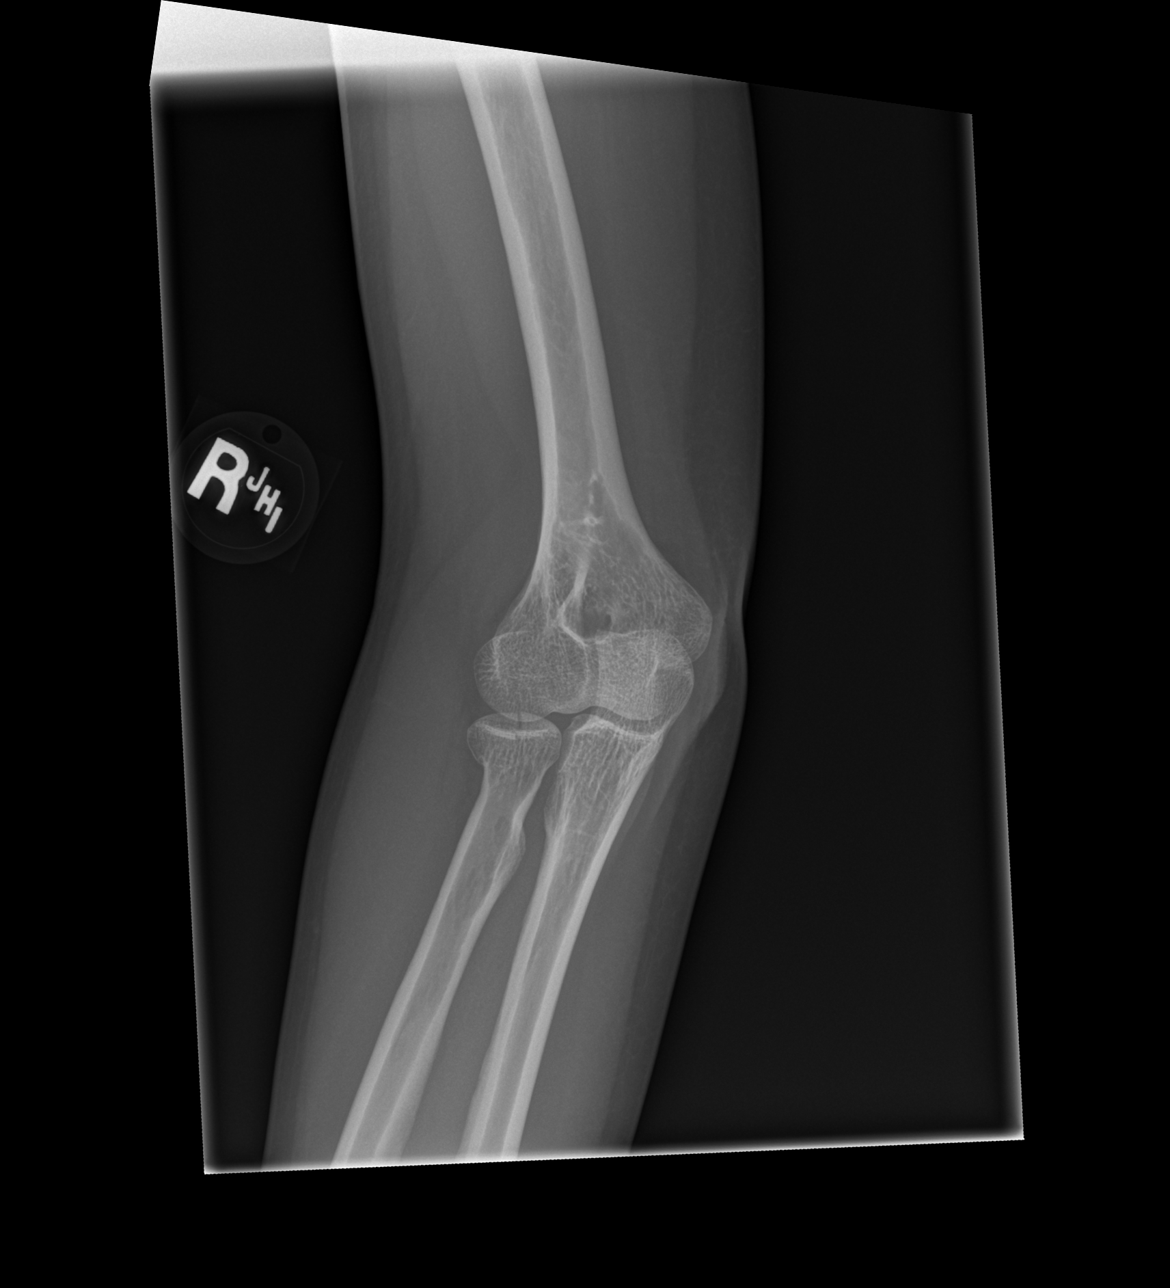

[4 of 4 positions shown; findings below may reference images not displayed]

FINDINGS: Nondisplaced right radial head fracture with right elbow joint
effusion. No additional fracture. No dislocation. No suspicious
focal osseous lesion. No radiopaque foreign body.
IMPRESSION: Nondisplaced right radial head fracture with right elbow joint
effusion.

## 2021-03-22 ENCOUNTER — Other Ambulatory Visit (HOSPITAL_BASED_OUTPATIENT_CLINIC_OR_DEPARTMENT_OTHER): Payer: Self-pay

## 2024-02-15 ENCOUNTER — Other Ambulatory Visit (HOSPITAL_BASED_OUTPATIENT_CLINIC_OR_DEPARTMENT_OTHER): Payer: Self-pay | Admitting: Family Medicine

## 2024-02-15 DIAGNOSIS — E7800 Pure hypercholesterolemia, unspecified: Secondary | ICD-10-CM

## 2024-02-29 ENCOUNTER — Ambulatory Visit (HOSPITAL_BASED_OUTPATIENT_CLINIC_OR_DEPARTMENT_OTHER)
Admission: RE | Admit: 2024-02-29 | Discharge: 2024-02-29 | Disposition: A | Discharge status: Home / Self Care | Payer: Self-pay | Source: Ambulatory Visit | Attending: Family Medicine | Admitting: Family Medicine

## 2024-02-29 DIAGNOSIS — E7800 Pure hypercholesterolemia, unspecified: Secondary | ICD-10-CM | POA: Insufficient documentation
